# Patient Record
Sex: Female | Born: 1950 | Race: Black or African American | Hispanic: No | Smoking: Current every day smoker
Health system: Southern US, Community
[De-identification: ages and names within clinical notes are randomized; demographics above are authoritative.]

## PROBLEM LIST (undated history)

## (undated) DIAGNOSIS — I10 Essential (primary) hypertension: Secondary | ICD-10-CM

## (undated) DIAGNOSIS — I509 Heart failure, unspecified: Secondary | ICD-10-CM

## (undated) DIAGNOSIS — J45909 Unspecified asthma, uncomplicated: Secondary | ICD-10-CM

## (undated) HISTORY — PX: HERNIA REPAIR: SHX51

## (undated) HISTORY — PX: APPENDECTOMY: SHX54

---

## 2004-03-27 ENCOUNTER — Emergency Department: Payer: Self-pay | Admitting: Emergency Medicine

## 2004-03-28 ENCOUNTER — Inpatient Hospital Stay: Payer: Self-pay | Admitting: General Surgery

## 2006-03-10 ENCOUNTER — Emergency Department: Payer: Self-pay | Admitting: Emergency Medicine

## 2006-03-26 ENCOUNTER — Emergency Department: Payer: Self-pay | Admitting: Emergency Medicine

## 2006-05-04 ENCOUNTER — Emergency Department: Payer: Self-pay | Admitting: Unknown Physician Specialty

## 2006-10-21 ENCOUNTER — Emergency Department: Payer: Self-pay | Admitting: Emergency Medicine

## 2006-10-21 ENCOUNTER — Other Ambulatory Visit: Payer: Self-pay

## 2006-11-01 ENCOUNTER — Emergency Department: Payer: Self-pay | Admitting: Emergency Medicine

## 2009-03-01 ENCOUNTER — Emergency Department: Payer: Self-pay | Admitting: Emergency Medicine

## 2010-04-14 ENCOUNTER — Emergency Department: Payer: Self-pay | Admitting: Emergency Medicine

## 2011-03-24 IMAGING — US US EXTREM LOW VENOUS*R*
1 series · 18 of 22 positions shown · non-contrast
Comparison: none

REASON FOR EXAM: pain no trauma
COMMENTS:   LMP: Post-Menopausal

PROCEDURE:     US  - US DOPPLER LOW EXTR RIGHT  - March 01, 2009 [DATE]
RESULT:     The right femoral and popliteal veins are normally compressible.
The waveform patterns are normal and the color flow images are normal.

[Series 1: us extrem low venous*right* · 18 of 22 slices shown]
[im 1/22]
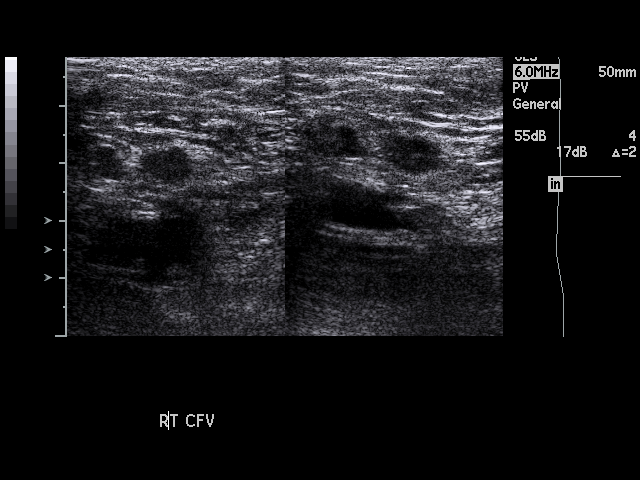
[im 2/22]
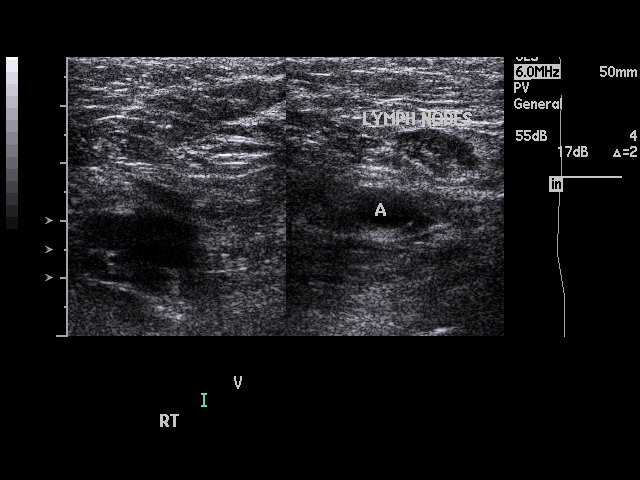
[im 4/22]
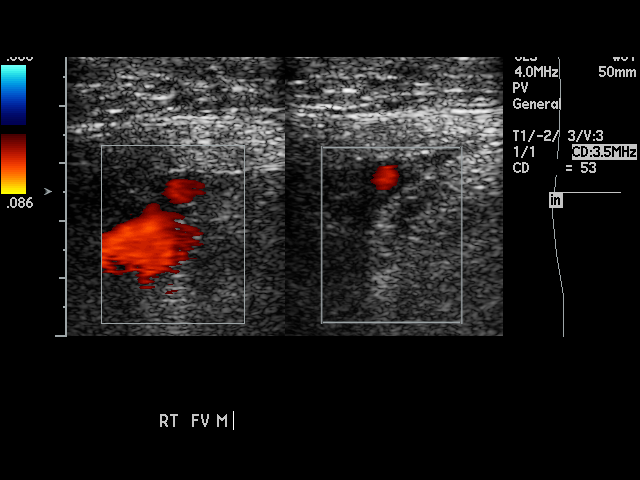
[im 5/22]
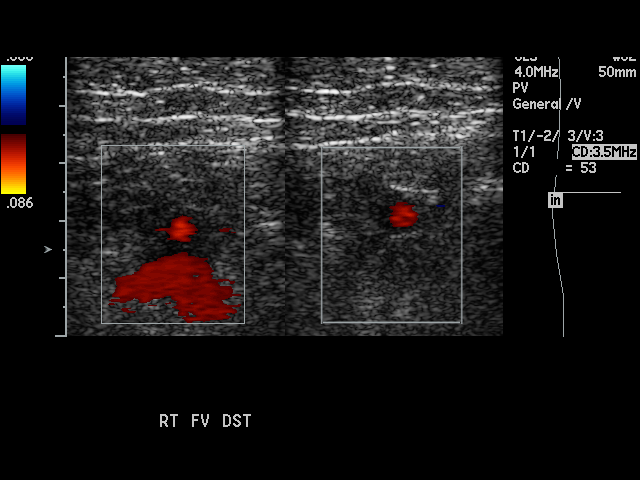
[im 6/22]
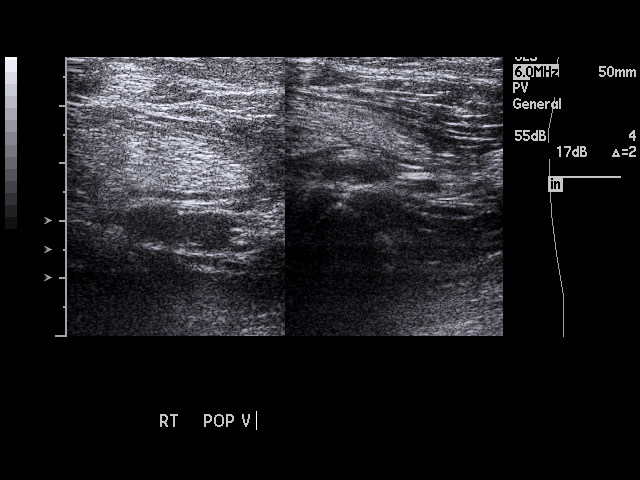
[im 7/22]
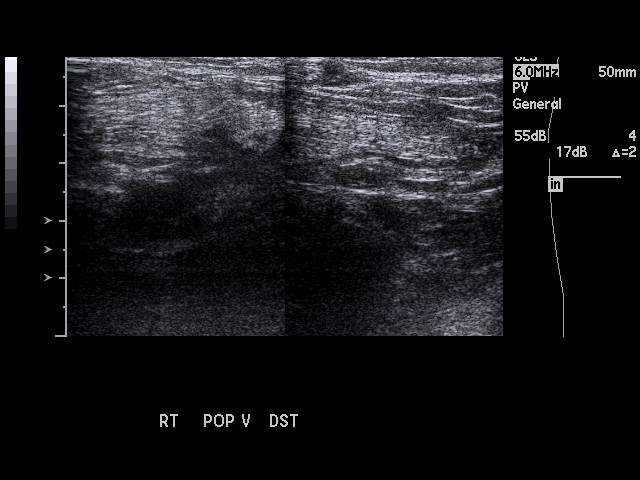
[im 8/22]
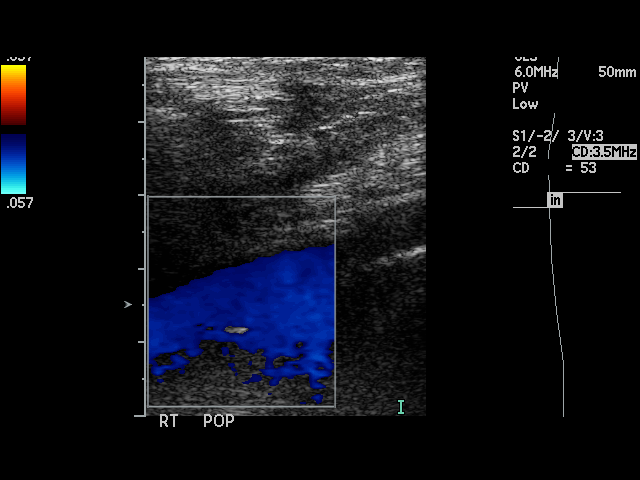
[im 10/22]
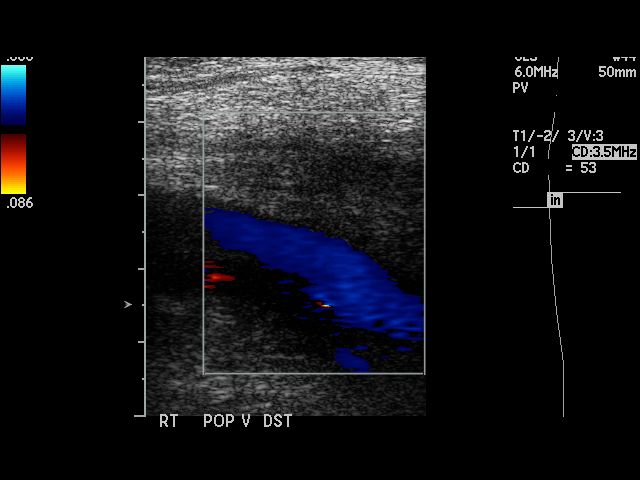
[im 11/22]
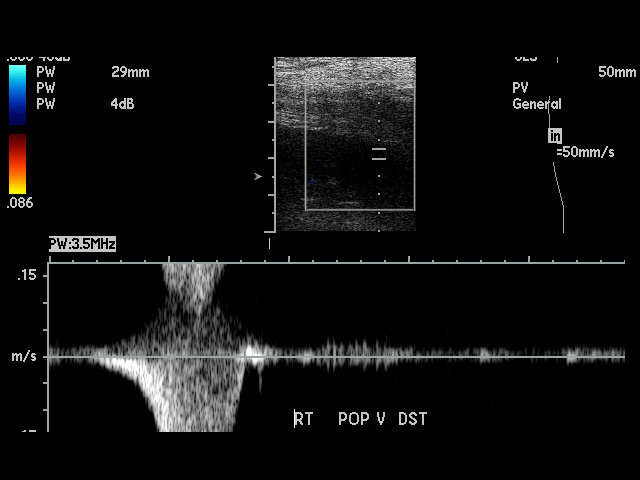
[im 12/22]
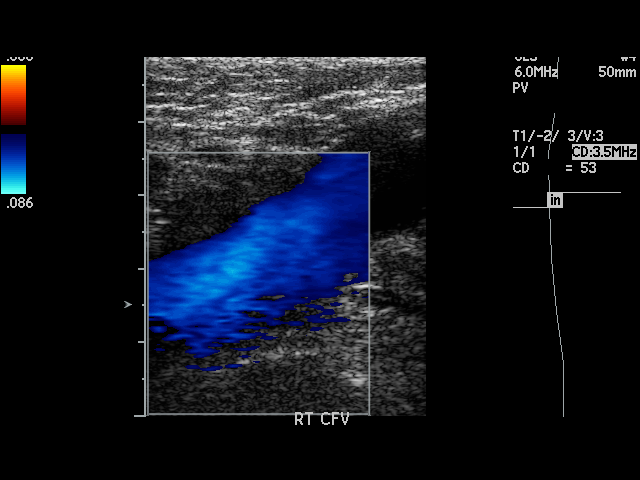
[im 13/22]
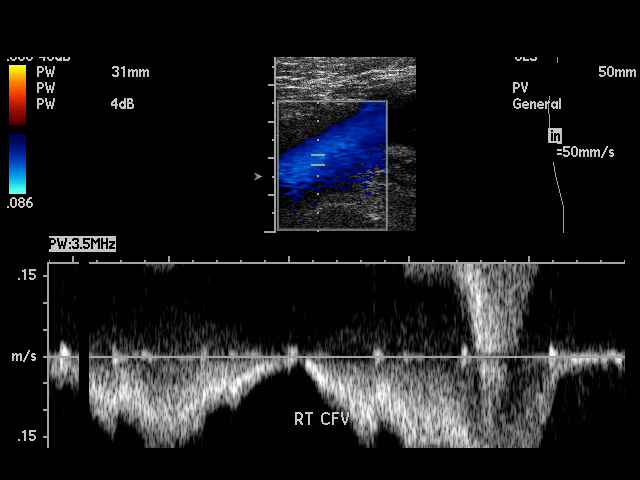
[im 15/22]
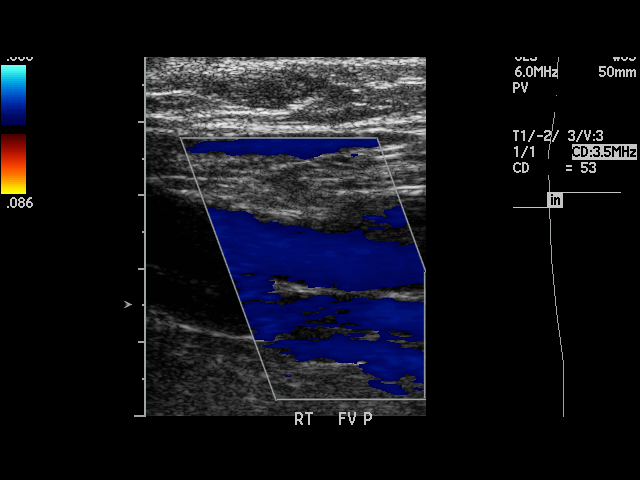
[im 16/22]
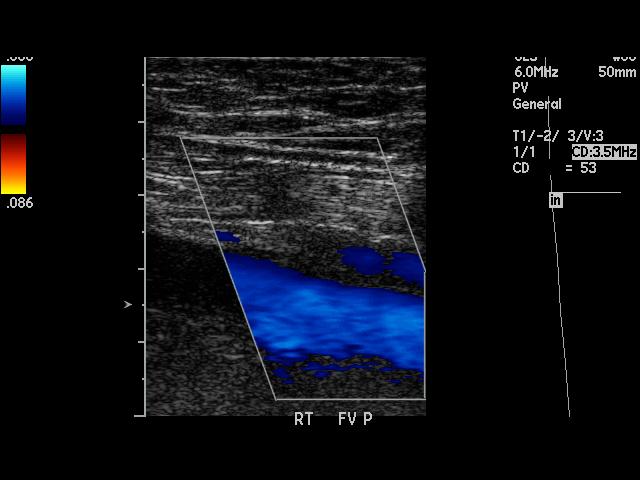
[im 17/22]
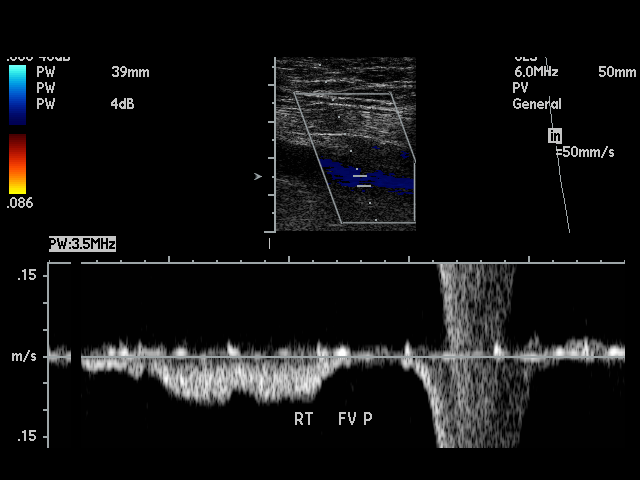
[im 18/22]
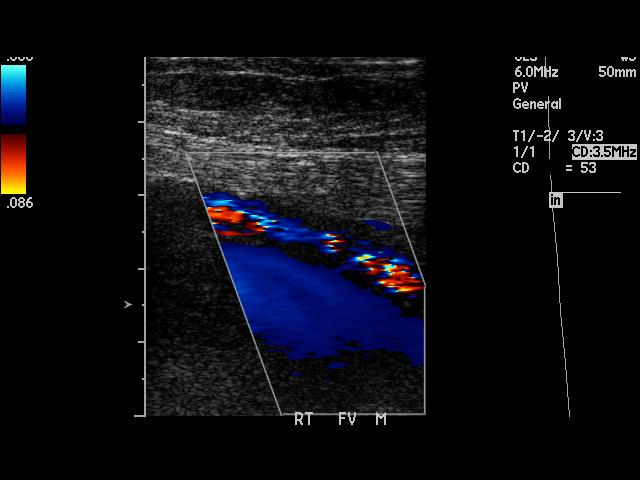
[im 19/22]
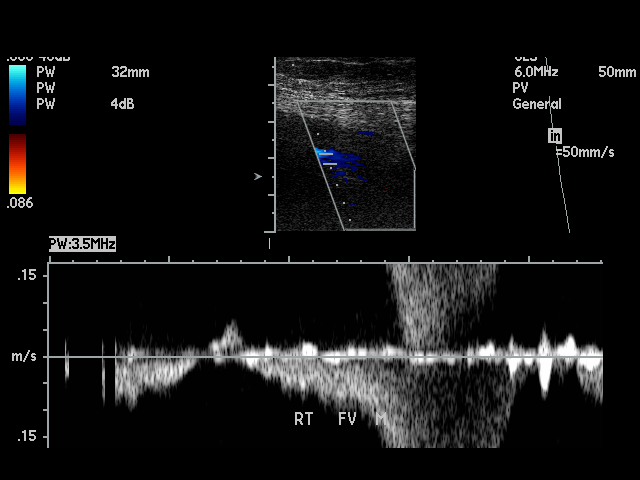
[im 21/22]
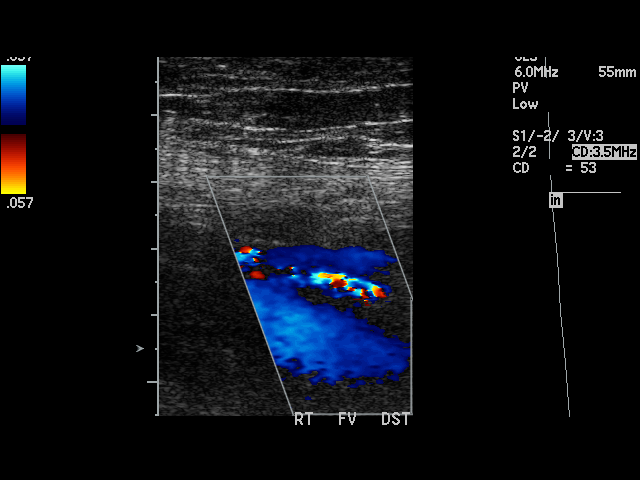
[im 22/22]
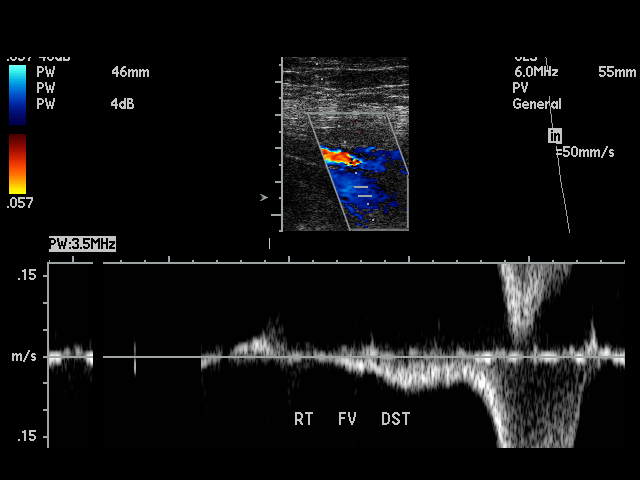

[18 of 22 positions shown; findings below may reference images not displayed]

IMPRESSION: I see no evidence of thrombus within the right femoral or
popliteal veins

## 2015-05-05 ENCOUNTER — Encounter: Payer: Self-pay | Admitting: Emergency Medicine

## 2015-05-05 ENCOUNTER — Emergency Department: Payer: Self-pay

## 2015-05-05 ENCOUNTER — Emergency Department
Admission: EM | Admit: 2015-05-05 | Discharge: 2015-05-05 | Disposition: A | Discharge status: Home / Self Care | Payer: Self-pay | Attending: Emergency Medicine | Admitting: Emergency Medicine

## 2015-05-05 ENCOUNTER — Other Ambulatory Visit: Payer: Self-pay

## 2015-05-05 DIAGNOSIS — I509 Heart failure, unspecified: Secondary | ICD-10-CM | POA: Insufficient documentation

## 2015-05-05 DIAGNOSIS — M7989 Other specified soft tissue disorders: Secondary | ICD-10-CM

## 2015-05-05 DIAGNOSIS — J45909 Unspecified asthma, uncomplicated: Secondary | ICD-10-CM | POA: Insufficient documentation

## 2015-05-05 DIAGNOSIS — I10 Essential (primary) hypertension: Secondary | ICD-10-CM | POA: Insufficient documentation

## 2015-05-05 DIAGNOSIS — F17200 Nicotine dependence, unspecified, uncomplicated: Secondary | ICD-10-CM | POA: Insufficient documentation

## 2015-05-05 HISTORY — DX: Heart failure, unspecified: I50.9

## 2015-05-05 HISTORY — DX: Unspecified asthma, uncomplicated: J45.909

## 2015-05-05 HISTORY — DX: Essential (primary) hypertension: I10

## 2015-05-05 LAB — CBC
HCT: 50 % — ABNORMAL HIGH (ref 35.0–47.0)
Hemoglobin: 15.9 g/dL (ref 12.0–16.0)
MCH: 32.6 pg (ref 26.0–34.0)
MCHC: 31.8 g/dL — ABNORMAL LOW (ref 32.0–36.0)
MCV: 102.5 fL — AB (ref 80.0–100.0)
PLATELETS: 153 10*3/uL (ref 150–440)
RBC: 4.88 MIL/uL (ref 3.80–5.20)
RDW: 15.1 % — AB (ref 11.5–14.5)
WBC: 5.7 10*3/uL (ref 3.6–11.0)

## 2015-05-05 LAB — BASIC METABOLIC PANEL
Anion gap: 5 (ref 5–15)
BUN: 13 mg/dL (ref 6–20)
CALCIUM: 8.4 mg/dL — AB (ref 8.9–10.3)
CO2: 38 mmol/L — AB (ref 22–32)
CREATININE: 1 mg/dL (ref 0.44–1.00)
Chloride: 98 mmol/L — ABNORMAL LOW (ref 101–111)
GFR, EST NON AFRICAN AMERICAN: 59 mL/min — AB (ref >60)
Glucose, Bld: 106 mg/dL — ABNORMAL HIGH (ref 65–99)
Potassium: 4.5 mmol/L (ref 3.5–5.1)
Sodium: 141 mmol/L (ref 135–145)

## 2015-05-05 LAB — BRAIN NATRIURETIC PEPTIDE: B NATRIURETIC PEPTIDE 5: 927 pg/mL — AB (ref 0.0–100.0)

## 2015-05-05 LAB — TROPONIN I

## 2015-05-05 MED ORDER — ALBUTEROL SULFATE HFA 108 (90 BASE) MCG/ACT IN AERS: 2.0000 | INHALATION_SPRAY | Freq: Four times a day (QID) | RESPIRATORY_TRACT | Status: AC | PRN

## 2015-05-05 MED ORDER — HYDROCHLOROTHIAZIDE 12.5 MG PO TABS: 12.5000 mg | ORAL_TABLET | Freq: Every day | ORAL | Status: AC

## 2015-05-05 NOTE — ED Notes (Signed)
Family repeatedly asking "how much longer until we can leave" .  Dr. Geraldo PitterGoodwin aware of families question.  Currently waiting for Cardiology to return call.  Family informed or reason for delay.  Continue to monitor

## 2015-05-05 NOTE — Discharge Instructions (Signed)
Please seek medical attention for any high fevers, chest pain, shortness of breath, change in behavior, persistent vomiting, bloody stool or any other new or concerning symptoms.   Heart Failure Heart failure is a condition in which the heart has trouble pumping blood. This means your heart does not pump blood efficiently for your body to work well. In some cases of heart failure, fluid may back up into your lungs or you may have swelling (edema) in your lower legs. Heart failure is usually a long-term (chronic) condition. It is important for you to take good care of yourself and follow your health care provider's treatment plan. CAUSES  Some health conditions can cause heart failure. Those health conditions include:  High blood pressure (hypertension). Hypertension causes the heart muscle to work harder than normal. When pressure in the blood vessels is high, the heart needs to pump (contract) with more force in order to circulate blood throughout the body. High blood pressure eventually causes the heart to become stiff and weak.  Coronary artery disease (CAD). CAD is the buildup of cholesterol and fat (plaque) in the arteries of the heart. The blockage in the arteries deprives the heart muscle of oxygen and blood. This can cause chest pain and may lead to a heart attack. High blood pressure can also contribute to CAD.  Heart attack (myocardial infarction). A heart attack occurs when one or more arteries in the heart become blocked. The loss of oxygen damages the muscle tissue of the heart. When this happens, part of the heart muscle dies. The injured tissue does not contract as well and weakens the heart's ability to pump blood.  Abnormal heart valves. When the heart valves do not open and close properly, it can cause heart failure. This makes the heart muscle pump harder to keep the blood flowing.  Heart muscle disease (cardiomyopathy or myocarditis). Heart muscle disease is damage to the heart  muscle from a variety of causes. These can include drug or alcohol abuse, infections, or unknown reasons. These can increase the risk of heart failure.  Lung disease. Lung disease makes the heart work harder because the lungs do not work properly. This can cause a strain on the heart, leading it to fail.  Diabetes. Diabetes increases the risk of heart failure. High blood sugar contributes to high fat (lipid) levels in the blood. Diabetes can also cause slow damage to tiny blood vessels that carry important nutrients to the heart muscle. When the heart does not get enough oxygen and food, it can cause the heart to become weak and stiff. This leads to a heart that does not contract efficiently.  Other conditions can contribute to heart failure. These include abnormal heart rhythms, thyroid problems, and low blood counts (anemia). Certain unhealthy behaviors can increase the risk of heart failure, including:  Being overweight.  Smoking or chewing tobacco.  Eating foods high in fat and cholesterol.  Abusing illicit drugs or alcohol.  Lacking physical activity. SYMPTOMS  Heart failure symptoms may vary and can be hard to detect. Symptoms may include:  Shortness of breath with activity, such as climbing stairs.  Persistent cough.  Swelling of the feet, ankles, legs, or abdomen.  Unexplained weight gain.  Difficulty breathing when lying flat (orthopnea).  Waking from sleep because of the need to sit up and get more air.  Rapid heartbeat.  Fatigue and loss of energy.  Feeling light-headed, dizzy, or close to fainting.  Loss of appetite.  Nausea.  Increased urination during  the night (nocturia). DIAGNOSIS  A diagnosis of heart failure is based on your history, symptoms, physical examination, and diagnostic tests. Diagnostic tests for heart failure may include:  Echocardiography.  Electrocardiography.  Chest X-ray.  Blood tests.  Exercise stress test.  Cardiac  angiography.  Radionuclide scans. TREATMENT  Treatment is aimed at managing the symptoms of heart failure. Medicines, behavioral changes, or surgical intervention may be necessary to treat heart failure.  Medicines to help treat heart failure may include:  Angiotensin-converting enzyme (ACE) inhibitors. This type of medicine blocks the effects of a blood protein called angiotensin-converting enzyme. ACE inhibitors relax (dilate) the blood vessels and help lower blood pressure.  Angiotensin receptor blockers (ARBs). This type of medicine blocks the actions of a blood protein called angiotensin. Angiotensin receptor blockers dilate the blood vessels and help lower blood pressure.  Water pills (diuretics). Diuretics cause the kidneys to remove salt and water from the blood. The extra fluid is removed through urination. This loss of extra fluid lowers the volume of blood the heart pumps.  Beta blockers. These prevent the heart from beating too fast and improve heart muscle strength.  Digitalis. This increases the force of the heartbeat.  Healthy behavior changes include:  Obtaining and maintaining a healthy weight.  Stopping smoking or chewing tobacco.  Eating heart-healthy foods.  Limiting or avoiding alcohol.  Stopping illicit drug use.  Physical activity as directed by your health care provider.  Surgical treatment for heart failure may include:  A procedure to open blocked arteries, repair damaged heart valves, or remove damaged heart muscle tissue.  A pacemaker to improve heart muscle function and control certain abnormal heart rhythms.  An internal cardioverter defibrillator to treat certain serious abnormal heart rhythms.  A left ventricular assist device (LVAD) to assist the pumping ability of the heart. HOME CARE INSTRUCTIONS   Take medicines only as directed by your health care provider. Medicines are important in reducing the workload of your heart, slowing the  progression of heart failure, and improving your symptoms.  Do not stop taking your medicine unless directed by your health care provider.  Do not skip any dose of medicine.  Refill your prescriptions before you run out of medicine. Your medicines are needed every day.  Engage in moderate physical activity if directed by your health care provider. Moderate physical activity can benefit some people. The elderly and people with severe heart failure should consult with a health care provider for physical activity recommendations.  Eat heart-healthy foods. Food choices should be free of trans fat and low in saturated fat, cholesterol, and salt (sodium). Healthy choices include fresh or frozen fruits and vegetables, fish, lean meats, legumes, fat-free or low-fat dairy products, and whole grain or high fiber foods. Talk to a dietitian to learn more about heart-healthy foods.  Limit sodium if directed by your health care provider. Sodium restriction may reduce symptoms of heart failure in some people. Talk to a dietitian to learn more about heart-healthy seasonings.  Use healthy cooking methods. Healthy cooking methods include roasting, grilling, broiling, baking, poaching, steaming, or stir-frying. Talk to a dietitian to learn more about healthy cooking methods.  Limit fluids if directed by your health care provider. Fluid restriction may reduce symptoms of heart failure in some people.  Weigh yourself every day. Daily weights are important in the early recognition of excess fluid. You should weigh yourself every morning after you urinate and before you eat breakfast. Wear the same amount of clothing each time  you weigh yourself. Record your daily weight. Provide your health care provider with your weight record.  Monitor and record your blood pressure if directed by your health care provider.  Check your pulse if directed by your health care provider.  Lose weight if directed by your health care  provider. Weight loss may reduce symptoms of heart failure in some people.  Stop smoking or chewing tobacco. Nicotine makes your heart work harder by causing your blood vessels to constrict. Do not use nicotine gum or patches before talking to your health care provider.  Keep all follow-up visits as directed by your health care provider. This is important.  Limit alcohol intake to no more than 1 drink per day for nonpregnant women and 2 drinks per day for men. One drink equals 12 ounces of beer, 5 ounces of wine, or 1 ounces of hard liquor. Drinking more than that is harmful to your heart. Tell your health care provider if you drink alcohol several times a week. Talk with your health care provider about whether alcohol is safe for you. If your heart has already been damaged by alcohol or you have severe heart failure, drinking alcohol should be stopped completely.  Stop illicit drug use.  Stay up-to-date with immunizations. It is especially important to prevent respiratory infections through current pneumococcal and influenza immunizations.  Manage other health conditions such as hypertension, diabetes, thyroid disease, or abnormal heart rhythms as directed by your health care provider.  Learn to manage stress.  Plan rest periods when fatigued.  Learn strategies to manage high temperatures. If the weather is extremely hot:  Avoid vigorous physical activity.  Use air conditioning or fans or seek a cooler location.  Avoid caffeine and alcohol.  Wear loose-fitting, lightweight, and light-colored clothing.  Learn strategies to manage cold temperatures. If the weather is extremely cold:  Avoid vigorous physical activity.  Layer clothes.  Wear mittens or gloves, a hat, and a scarf when going outside.  Avoid alcohol.  Obtain ongoing education and support as needed.  Participate in or seek rehabilitation as needed to maintain or improve independence and quality of life. SEEK MEDICAL  CARE IF:   You have a rapid weight gain.  You have increasing shortness of breath that is unusual for you.  You are unable to participate in your usual physical activities.  You tire easily.  You cough more than normal, especially with physical activity.  You have any or more swelling in areas such as your hands, feet, ankles, or abdomen.  You are unable to sleep because it is hard to breathe.  You feel like your heart is beating fast (palpitations).  You become dizzy or light-headed upon standing up. SEEK IMMEDIATE MEDICAL CARE IF:   You have difficulty breathing.  There is a change in mental status such as decreased alertness or difficulty with concentration.  You have a pain or discomfort in your chest.  You have an episode of fainting (syncope). MAKE SURE YOU:   Understand these instructions.  Will watch your condition.  Will get help right away if you are not doing well or get worse.   This information is not intended to replace advice given to you by your health care provider. Make sure you discuss any questions you have with your health care provider.   Document Released: 06/07/2005 Document Revised: 10/22/2014 Document Reviewed: 07/07/2012 Elsevier Interactive Patient Education [employer]

## 2015-05-05 NOTE — ED Notes (Signed)
Pt to ed with c/o bilat lower extremity swelling x several days.  Pt states she is supposed to be taking fluid tablets and blood pressure pills but she does not take them because they are too costly.

## 2015-05-05 NOTE — ED Provider Notes (Signed)
[facility] Emergency Department Provider Note    ____________________________________________  Time seen: 1515  I have reviewed the triage vital signs and the nursing notes.   HISTORY  Chief Complaint Leg Swelling   History limited by: Not Limited   HPI Valerie Blackwell is a 64 y.o. female who presents to the emergency department today because of concerns for bilateral lower extremity swelling. The patient states this been going on and off for a couple of months. She states it is been worse the past couple of days. She denies any pain with the swelling. She denies any worsening shortness of breath. She states she does have asthma. She states that she has been put on hydrochlorothiazide in the past however has not taken it for a number of months. She denies any recent fevers, denies any recent travel. Denies being on any estrogen.     Past Medical History  Diagnosis Date  . Hypertension   . Asthma   . CHF (congestive heart failure) (HCC)     There are no active problems to display for this patient.   History reviewed. No pertinent past surgical history.  No current outpatient prescriptions on file.  Allergies Review of patient's allergies indicates no known allergies.  History reviewed. No pertinent family history.  Social History Social History  Substance Use Topics  . Smoking status: Current Every Day Smoker  . Smokeless tobacco: None  . Alcohol Use: Yes    Review of Systems  Constitutional: Negative for fever. Cardiovascular: Negative for chest pain. Respiratory: Positive for chronic shortness of breath. Gastrointestinal: Negative for abdominal pain, vomiting and diarrhea. Genitourinary: Negative for dysuria. Musculoskeletal: Negative for back pain. Skin: Negative for rash. Neurological: Negative for headaches, focal weakness or numbness.   10-point ROS otherwise  negative.  ____________________________________________   PHYSICAL EXAM:  VITAL SIGNS: ED Triage Vitals  Enc Vitals Group     BP 05/05/15 1402 148/98 mmHg     Pulse Rate 05/05/15 1402 109     Resp 05/05/15 1402 20     Temp 05/05/15 1402 98.7 F (37.1 C)     Temp Source 05/05/15 1402 Oral     SpO2 05/05/15 1402 97 %     Weight 05/05/15 1402 200 lb (90.719 kg)     Height 05/05/15 1402 5\' 9"  (1.753 m)     Head Cir --      Peak Flow --      Pain Score 05/05/15 1405 0   Constitutional: Alert and oriented. Well appearing and in no distress. Eyes: Conjunctivae are normal. PERRL. Normal extraocular movements. ENT   Head: Normocephalic and atraumatic.   Nose: No congestion/rhinnorhea.   Mouth/Throat: Mucous membranes are moist.   Neck: No stridor. Hematological/Lymphatic/Immunilogical: No cervical lymphadenopathy. Cardiovascular: Normal rate, regular rhythm.  No murmurs, rubs, or gallops. Respiratory: Normal respiratory effort without tachypnea nor retractions. Breath sounds are clear and equal bilaterally. No wheezes/rales/rhonchi. Gastrointestinal: Soft and nontender. No distention.  Genitourinary: Deferred Musculoskeletal: Normal range of motion in all extremities. No joint effusions.  2+ pitting edema to lower extremities. Neurologic:  Normal speech and language. No gross focal neurologic deficits are appreciated.  Skin:  Skin is warm, dry and intact. No rash noted. Psychiatric: Mood and affect are normal. Speech and behavior are normal. Patient exhibits appropriate insight and judgment.  ____________________________________________    LABS (pertinent positives/negatives)  Labs Reviewed  BASIC METABOLIC PANEL - Abnormal; Notable for the following:    Chloride 98 (*)  CO2 38 (*)    Glucose, Bld 106 (*)    Calcium 8.4 (*)    GFR calc non Af Amer 59 (*)    All other components within normal limits  CBC - Abnormal; Notable for the following:    HCT 50.0 (*)     MCV 102.5 (*)    MCHC 31.8 (*)    RDW 15.1 (*)    All other components within normal limits  BRAIN NATRIURETIC PEPTIDE - Abnormal; Notable for the following:    B Natriuretic Peptide 927.0 (*)    All other components within normal limits  TROPONIN I     ____________________________________________   EKG  I, Phineas Semen, attending physician, personally viewed and interpreted this EKG  EKG Time: 1410 Rate: 76 Rhythm: NSR Axis: normal Intervals: qtc 450 QRS: narrow, q waves V1 ST changes: no st elevation Impression: q waves V1, otherwise normal exam  ____________________________________________    RADIOLOGY  CXR  IMPRESSION: Cardiomegaly. Possible emphysema.   ____________________________________________   PROCEDURES  Procedure(s) performed: None  Critical Care performed: No  ____________________________________________   INITIAL IMPRESSION / ASSESSMENT AND PLAN / ED COURSE  Pertinent labs & imaging results that were available during my care of the patient were reviewed by me and considered in my medical decision making (see chart for details).  Patient presented to the emergency department today because of lower extremity swelling. The patient did have pitting edema. She has a history of just heart failure however has not been on her medications in a long time. She states she was on hydrochlorothiazide until a couple of months ago. Her BNP was elevated. However no signs of edema on the chest x-ray. Will give patient prescription for hydrochlorothiazide. Will have patient follow-up with congestive heart failure clinic.  ____________________________________________   FINAL CLINICAL IMPRESSION(S) / ED DIAGNOSES  Final diagnoses:  Leg swelling  Congestive heart failure, unspecified congestive heart failure chronicity, unspecified congestive heart failure type (HCC)     Phineas Semen, MD 05/05/15 2225

## 2015-05-05 NOTE — ED Notes (Signed)
AAOx3.  Skin warm and dry.  No SOB/ DOE.  NAD 

## 2015-05-06 ENCOUNTER — Encounter: Payer: Self-pay | Admitting: Family

## 2015-05-06 ENCOUNTER — Ambulatory Visit: Payer: Self-pay | Attending: Family | Admitting: Family

## 2015-05-06 VITALS — BP 156/73 | HR 78 | Resp 20 | Ht 69.0 in | Wt 252.0 lb

## 2015-05-06 DIAGNOSIS — Z720 Tobacco use: Secondary | ICD-10-CM | POA: Insufficient documentation

## 2015-05-06 DIAGNOSIS — Z79899 Other long term (current) drug therapy: Secondary | ICD-10-CM | POA: Insufficient documentation

## 2015-05-06 DIAGNOSIS — F17210 Nicotine dependence, cigarettes, uncomplicated: Secondary | ICD-10-CM | POA: Insufficient documentation

## 2015-05-06 DIAGNOSIS — I5032 Chronic diastolic (congestive) heart failure: Secondary | ICD-10-CM | POA: Insufficient documentation

## 2015-05-06 DIAGNOSIS — I10 Essential (primary) hypertension: Secondary | ICD-10-CM | POA: Insufficient documentation

## 2015-05-06 DIAGNOSIS — J45909 Unspecified asthma, uncomplicated: Secondary | ICD-10-CM | POA: Insufficient documentation

## 2015-05-06 NOTE — Progress Notes (Signed)
Subjective:    Patient ID: Valerie Blackwell, female    DOB: 02-09-1951, 64 y.o.   MRN: [id number]  Congestive Heart Failure Presents for initial visit. The disease course has been stable. Associated symptoms include edema, fatigue and shortness of breath. Pertinent negatives include no abdominal pain, chest pain, chest pressure, orthopnea or palpitations. The symptoms have been stable. Treatments tried: diuretics. The treatment provided mild relief. Her past medical history is significant for HTN. Compliance with total regimen is 76-100%.  Hypertension This is a chronic problem. The problem is unchanged. The problem is controlled. Associated symptoms include headaches, peripheral edema and shortness of breath. Pertinent negatives include no blurred vision, chest pain, neck pain or palpitations. There are no associated agents to hypertension. Risk factors for coronary artery disease include obesity, post-menopausal state and smoking/tobacco exposure. Past treatments include diuretics. The current treatment provides moderate improvement. Compliance problems include medication cost.  Hypertensive end-organ damage includes heart failure.    Past Medical History  Diagnosis Date  . Hypertension   . Asthma   . CHF (congestive heart failure) [facility])     Past Surgical History  Procedure Laterality Date  . Appendectomy    . Hernia repair      Family History  Problem Relation Age of Onset  . Diabetes Mother   . Colon cancer Father   . Cancer Sister   . Colon cancer Brother     Social History  Substance Use Topics  . Smoking status: Current Every Day Smoker -- 0.50 packs/day for 30 years  . Smokeless tobacco: Never Used  . Alcohol Use: 8.4 oz/week    14 Glasses of wine per week    No Known Allergies  Prior to Admission medications   Medication Sig Start Date End Date Taking? Authorizing Provider  albuterol (PROVENTIL HFA;VENTOLIN HFA) 108 (90 BASE) MCG/ACT inhaler Inhale 2 puffs into  the lungs every 6 (six) hours as needed for wheezing or shortness of breath. 05/05/15  Yes Phineas Semen, MD  hydrochlorothiazide (HYDRODIURIL) 12.5 MG tablet Take 1 tablet (12.5 mg total) by mouth daily. 05/05/15  Yes Phineas Semen, MD     Review of Systems  Constitutional: Positive for appetite change and fatigue.  HENT: Negative for congestion, postnasal drip and sore throat.   Eyes: Negative.  Negative for blurred vision.  Respiratory: Positive for cough (dry cough) and shortness of breath. Negative for chest tightness and wheezing.   Cardiovascular: Positive for leg swelling. Negative for chest pain and palpitations.  Gastrointestinal: Negative for abdominal pain and abdominal distention.  Endocrine: Negative.   Genitourinary: Negative.   Musculoskeletal: Negative for back pain and neck pain.  Skin: Negative for rash.       Blister on lateral aspect of left lower leg   Allergic/Immunologic: Negative.   Neurological: Positive for headaches. Negative for dizziness and light-headedness.  Hematological: Negative for adenopathy. Does not bruise/bleed easily.  Psychiatric/Behavioral: Positive for sleep disturbance (sleeping on  2 pillows) and dysphoric mood (sometimes). The patient is not nervous/anxious.        Objective:   Physical Exam  Constitutional: She is oriented to person, place, and time. She appears well-developed and well-nourished.  HENT:  Head: Normocephalic and atraumatic.  Eyes: Conjunctivae are normal. Pupils are equal, round, and reactive to light.  Neck: Normal range of motion. Neck supple.  Cardiovascular: Normal rate and regular rhythm.   Pulmonary/Chest: Effort normal. She has no wheezes. She has no rales.  Abdominal: Soft. She exhibits no distension. There  is no tenderness.  Musculoskeletal: She exhibits edema (2+ pitting edema in bilateral lower legs). She exhibits no tenderness.  Neurological: She is alert and oriented to person, place, and time.   Skin: Skin is warm and dry. Rash noted. Rash is vesicular (quarter sized blister on left lateral lower leg. currently unopened).  Psychiatric: She has a normal mood and affect. Her behavior is normal. Thought content normal.  Nursing note and vitals reviewed.  BP 156/73 mmHg  Pulse 78  Resp 20  Ht 5\' 9"  (1.753 m)  Wt 252 lb (114.306 kg)  BMI 37.20 kg/m2  SpO2 83%        Assessment & Plan:  1: Chronic heart failure with preserved ejection fraction- Patient presents to the office after being seen in the ER yesterday for increased swelling in her lower legs. She says that she used to be on a fluid pill but had stopped it. She has a prescription for the HCTZ but hasn't picked it up yet. She says that she's going to pick it up after leaving here today. Discussed the importance of getting the medication started today. She says that she doesn't elevate her legs very much and she was encouraged to elevate them as much as possible at home. Patient hasn't had an echocardiogram done because she currently doesn't have any insurance. She is planning on getting [facility] with [facility]. She doesn't have any scales so a set was given to her. Discussed the importance of weighing herself every morning and writing the weight down and to call for an overnight weight gain of >2 pounds or a weekly weight gain of >5 pounds. She does admit to using a salt shaker and she was encouraged to not add any salt to her food and to begin reading food labels. A 2000mg  sodium diet was discussed and written information was also given to her.  2: HTN- Blood pressure looks pretty good today. She is getting the HCTZ filled today. 3: Tobacco use- Patient says that she smokes 1/2 ppd of cigarettes and has smoked for many decades. Patient says that she really doesn't have any desire to quit smoking at this time. Complete cessation and abstaining from alcohol was discussed for 4  minutes.   Return in 1 month or sooner for any questions/problems before then.

## 2015-05-06 NOTE — Patient Instructions (Signed)
Begin weighing daily and call for an overnight weight gain of > 2 pounds or a weekly weight gain of >5 pounds. 

## 2015-06-05 ENCOUNTER — Ambulatory Visit: Payer: Self-pay | Admitting: Family

## 2016-09-19 DEATH — deceased

## 2017-05-27 IMAGING — CR DG CHEST 2V
1 series · 2 of 2 positions shown · non-contrast
Comparison: None.

CLINICAL DATA: Shortness of breath. Bilateral lower extremity
swelling. Cough.

EXAM:
CHEST  2 VIEW

[Series 1: dg chest 2 view · 0.14mm/px · 2 of 2 slices shown]
[im 1/2]
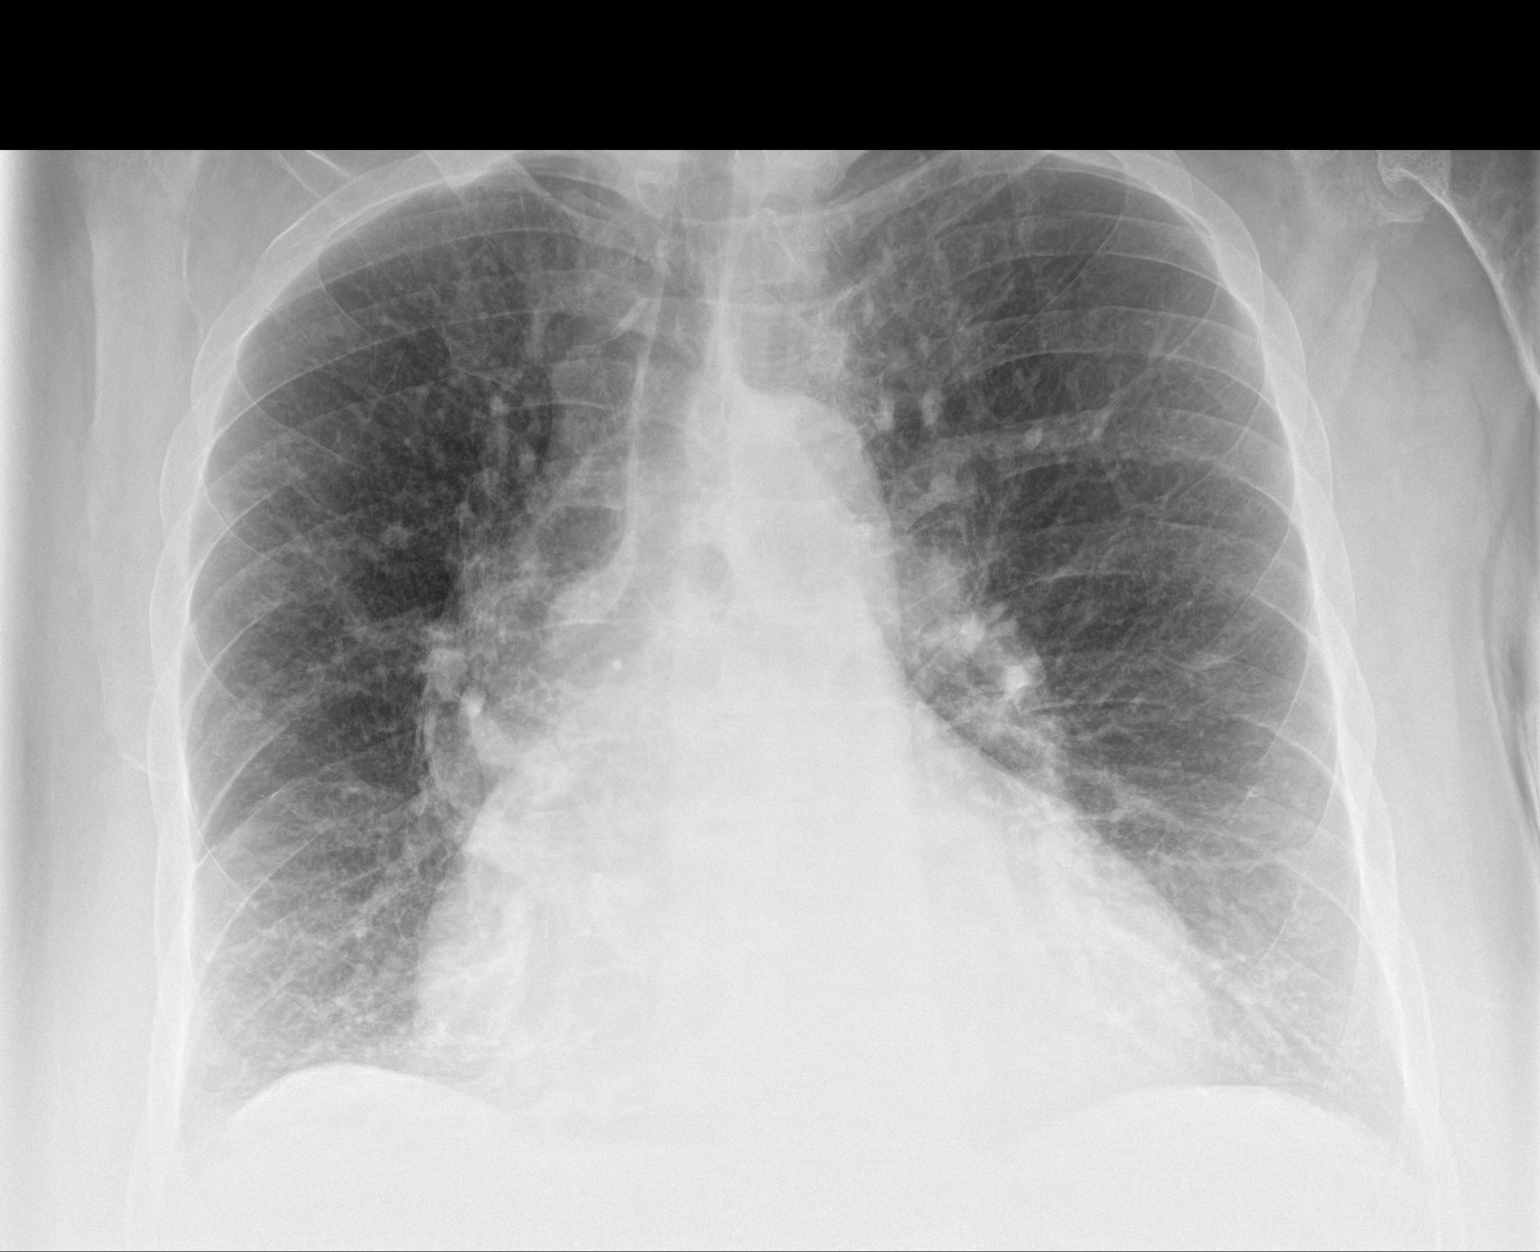
[im 2/2]
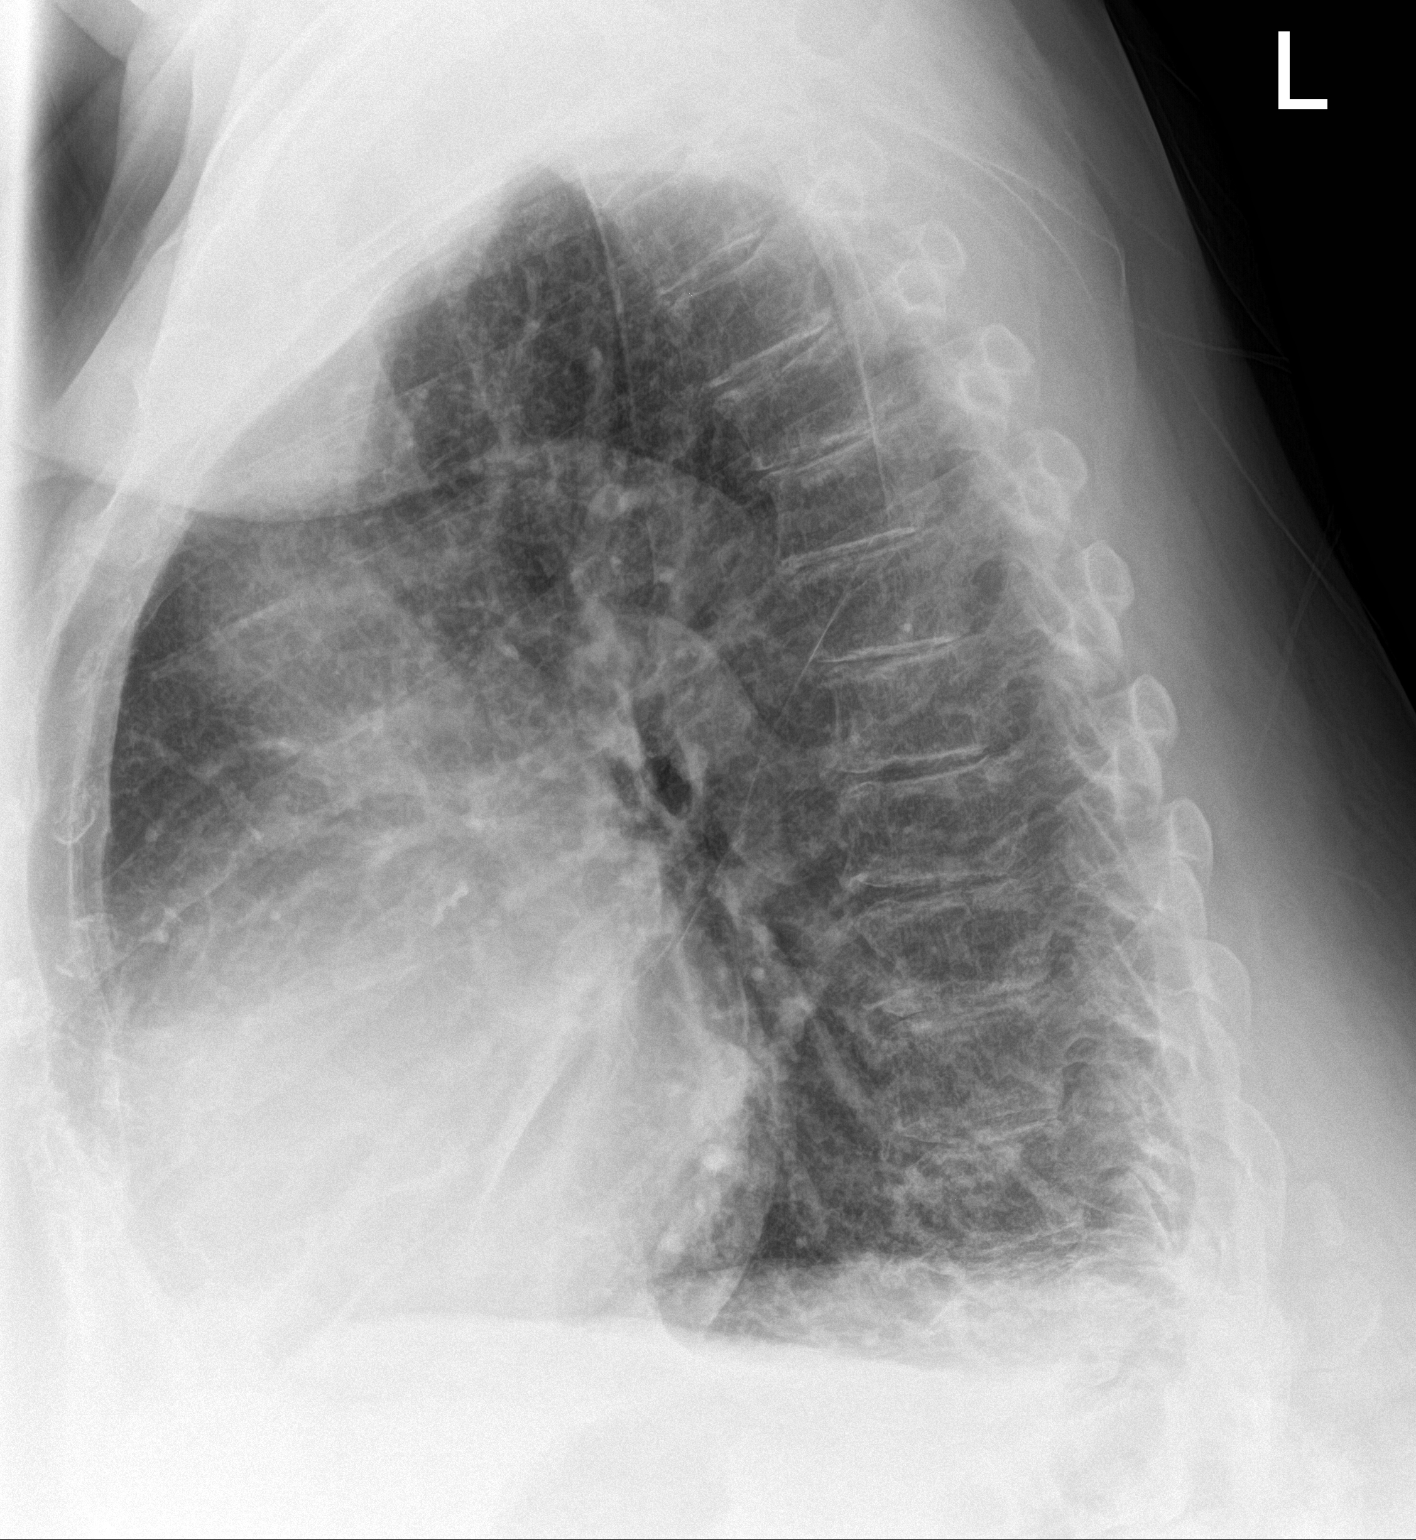

[2 of 2 positions shown; findings below may reference images not displayed]

FINDINGS: There is cardiomegaly. Pulmonary vascularity is within normal
limits. The lungs are somewhat hyperinflated with slight flattening
of the diaphragm suggesting emphysema. No discrete infiltrates or
effusions. No acute osseous abnormality.
IMPRESSION: Cardiomegaly.  Possible emphysema.

## 2017-09-13 IMAGING — US US EXTREM LOW VENOUS BILAT
1 series · 14 of 24 positions shown · non-contrast
Comparison: 03/01/2009

CLINICAL DATA: Leg swelling

EXAM:
BILATERAL LOWER EXTREMITY VENOUS DOPPLER ULTRASOUND
TECHNIQUE: Gray-scale sonography with compression, as well as color and duplex
ultrasound, were performed to evaluate the deep venous system from
the level of the common femoral vein through the popliteal and
proximal calf veins.

[Series 1: us extrem low venous bilat · 0.08mm/px · 14 of 68 slices shown]
[im 1/68]
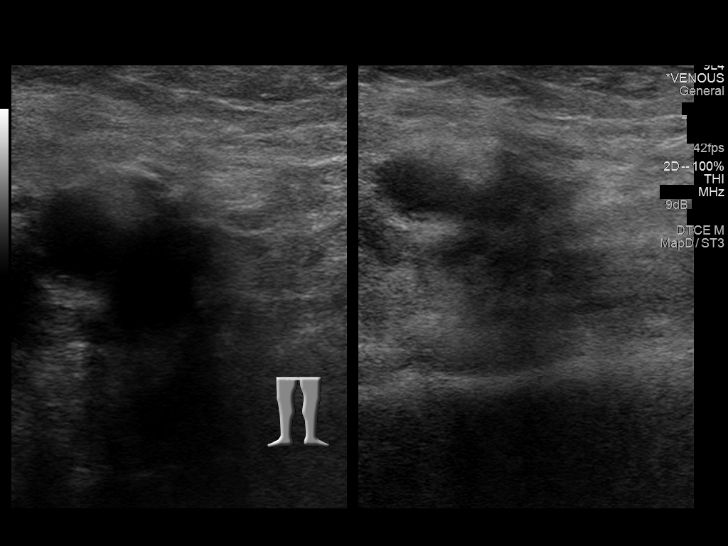
[im 6/68]
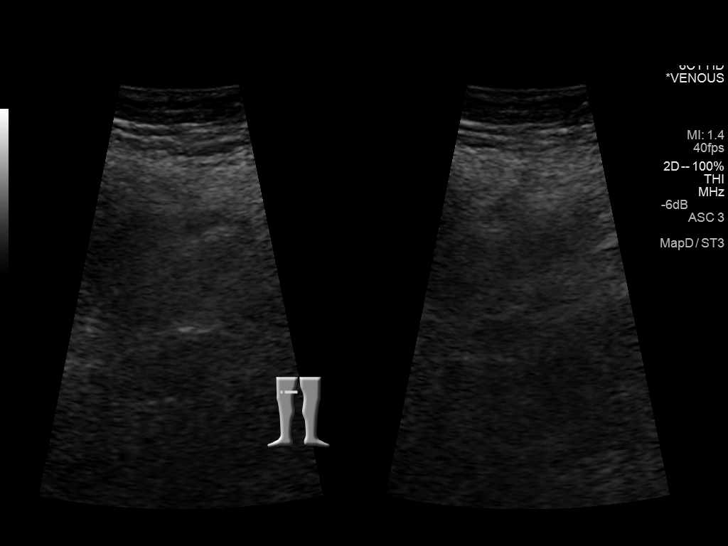
[im 12/68]
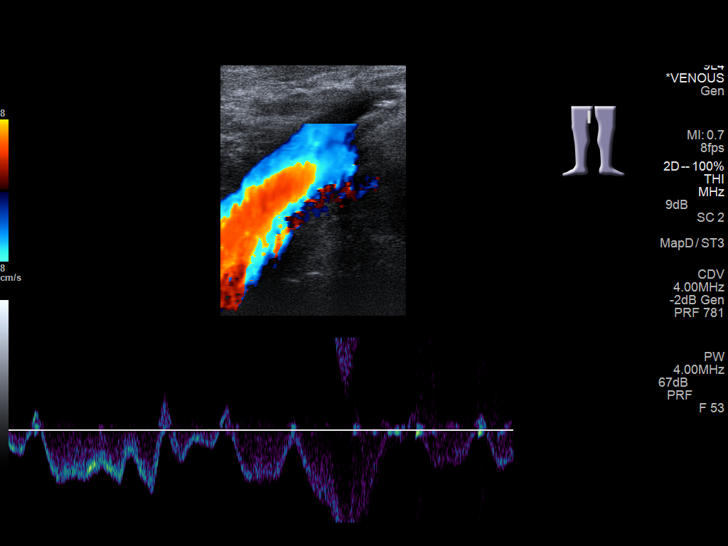
[im 18/68]
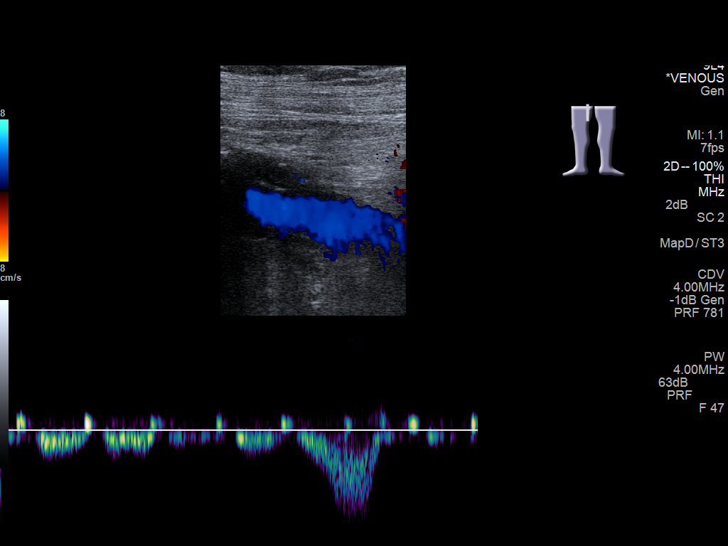
[im 21/68]
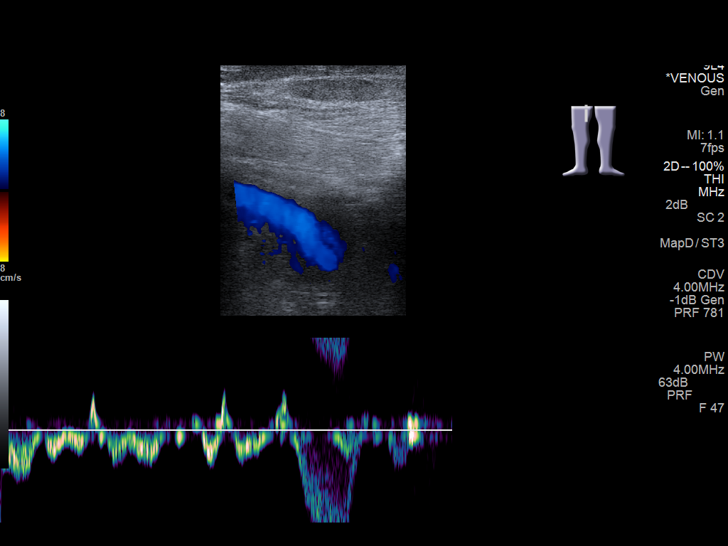
[im 27/68]
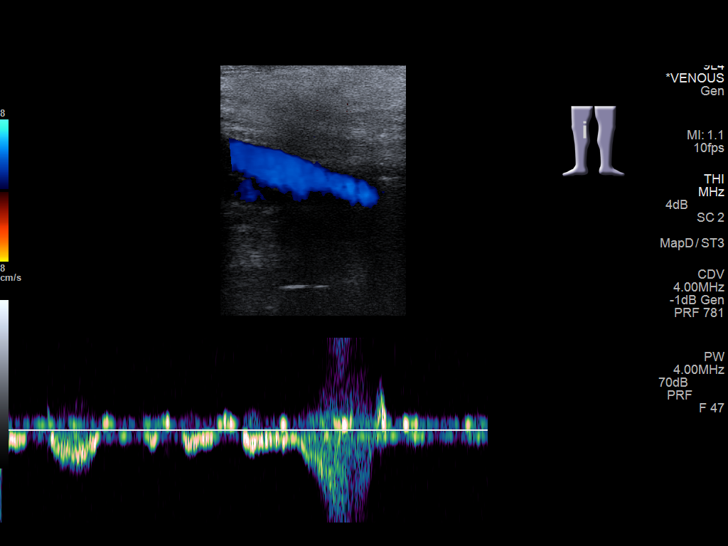
[im 33/68]
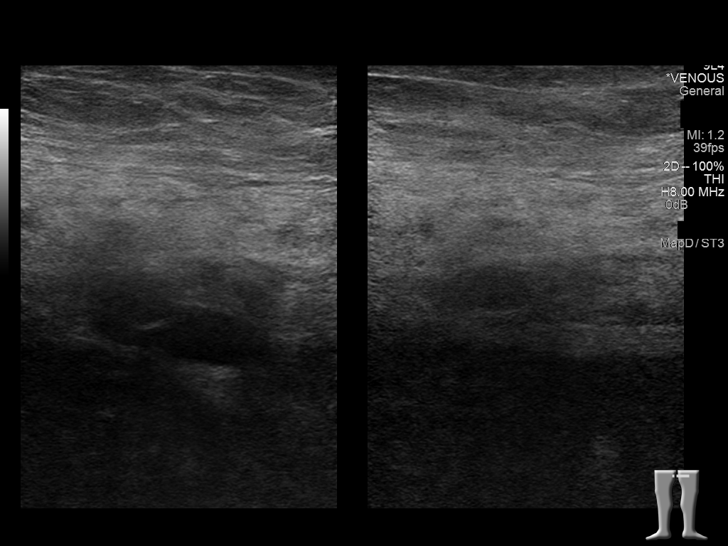
[im 35/68]
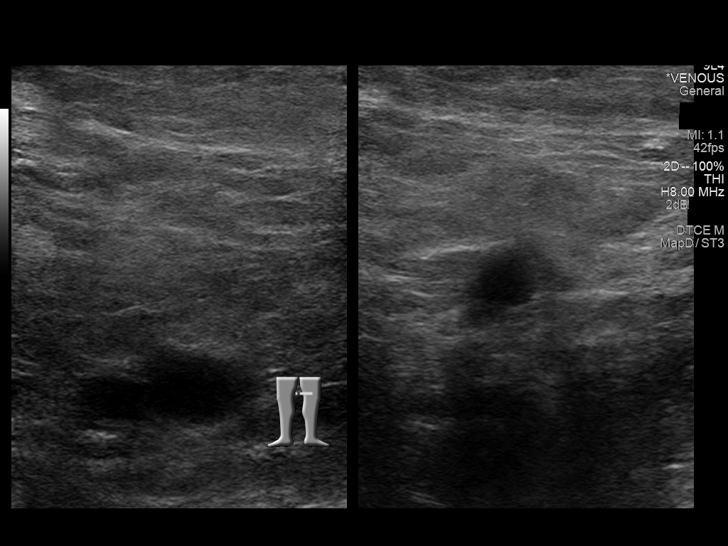
[im 41/68]
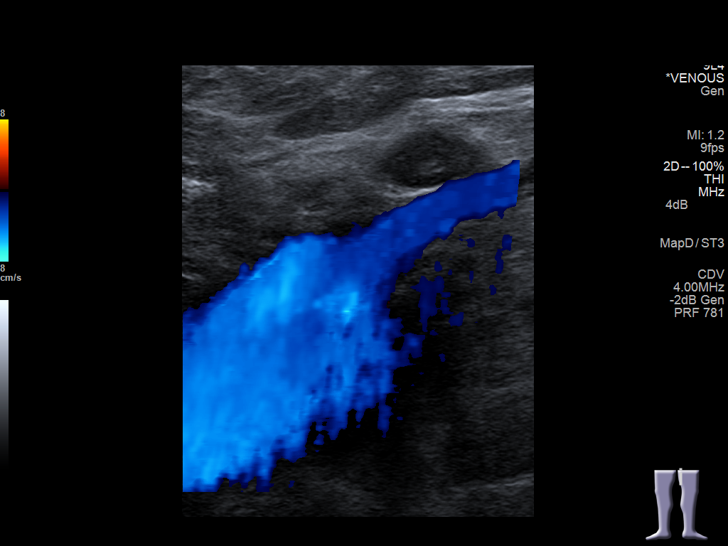
[im 47/68]
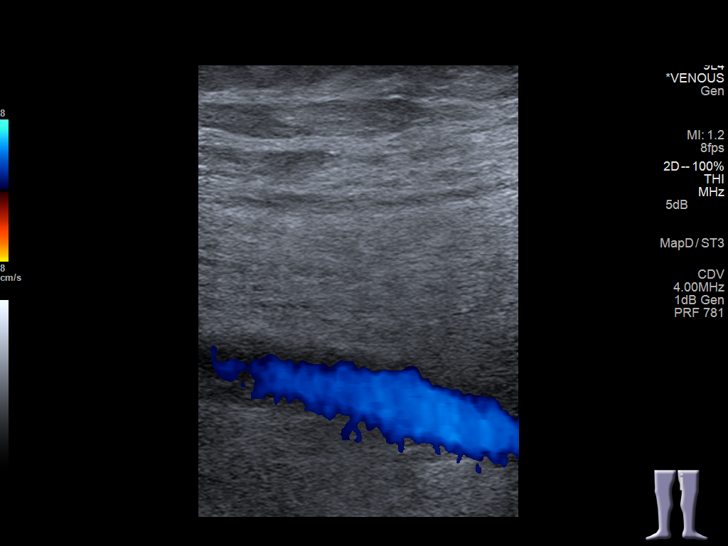
[im 53/68]
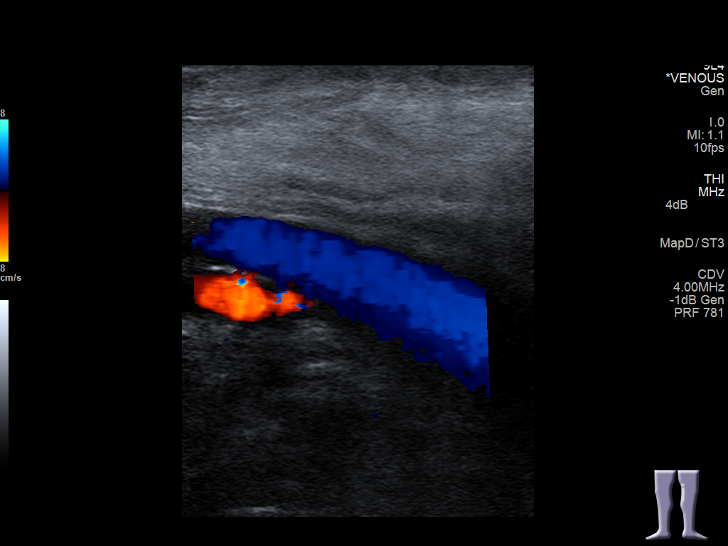
[im 56/68]
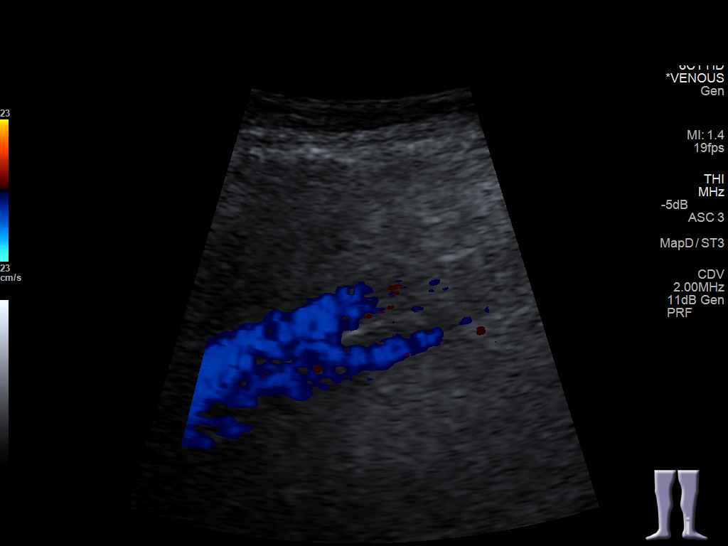
[im 62/68]
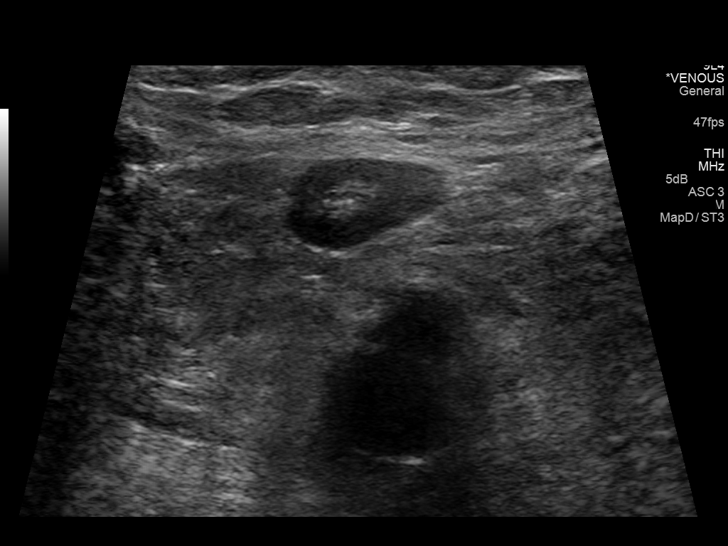
[im 68/68]
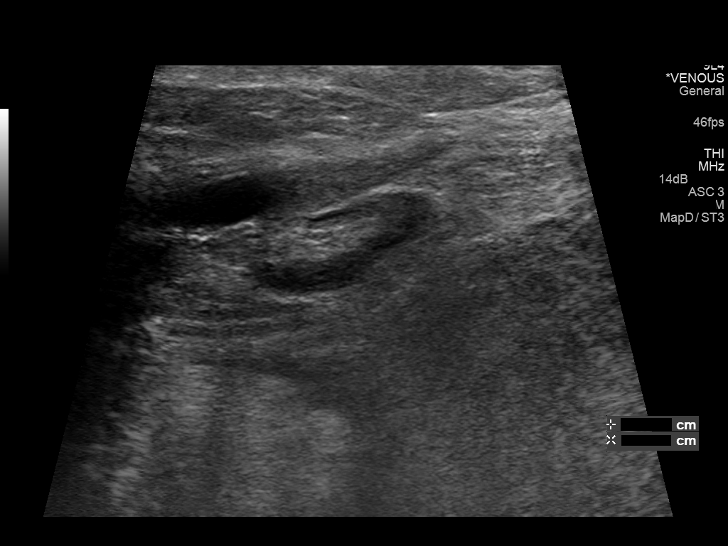

[14 of 24 positions shown; findings below may reference images not displayed]

FINDINGS: Normal compressibility of the common femoral, superficial femoral,
and popliteal veins, as well as the proximal calf veins. No filling
defects to suggest DVT on grayscale or color Doppler imaging.
Doppler waveforms show normal direction of venous flow, normal
respiratory phasicity and response to augmentation. Bilateral
inguinal lymph nodes less than 1 cm short axis diameter.
IMPRESSION: 1. No evidence of lower extremity deep vein thrombosis, bilaterally.
